# Patient Record
Sex: Male | Born: 1995 | Race: Black or African American | Hispanic: No | Marital: Single | Smoking: Never smoker
Health system: Southern US, Community
[De-identification: ages and names within clinical notes are randomized; demographics above are authoritative.]

---
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
## 2015-11-14 IMAGING — US US ART/VEN ABD/PELV/SCROTUM DOPPLER LTD
1 series · 13 of 25 positions shown · non-contrast
Comparison: None.

CLINICAL DATA: Left testicular pain and swelling for 2-3 hr.

EXAM:
SCROTAL ULTRASOUND
DOPPLER ULTRASOUND OF THE TESTICLES
TECHNIQUE: Complete ultrasound examination of the testicles, epididymis, and
other scrotal structures was performed. Color and spectral Doppler
ultrasound were also utilized to evaluate blood flow to the
testicles.

[Series 1: us art/ven abd/pelv/scrotum doppler ltd · 0.06mm/px · 13 of 60 slices shown]
[im 1/60]
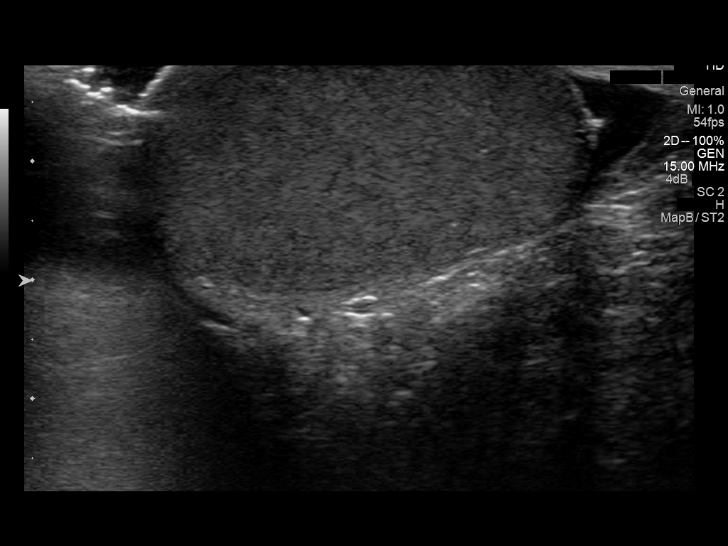
[im 5/60]
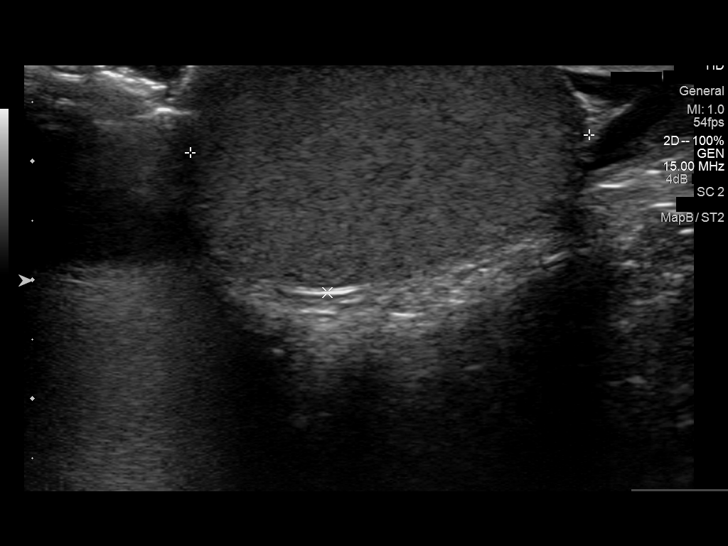
[im 10/60]
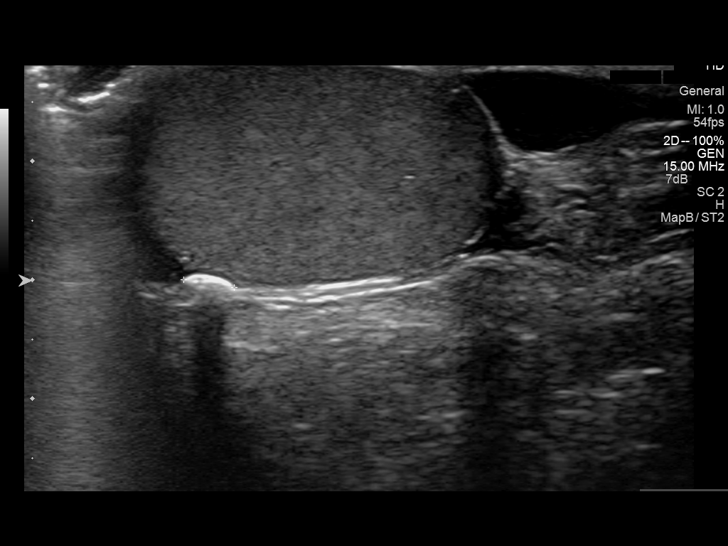
[im 15/60]
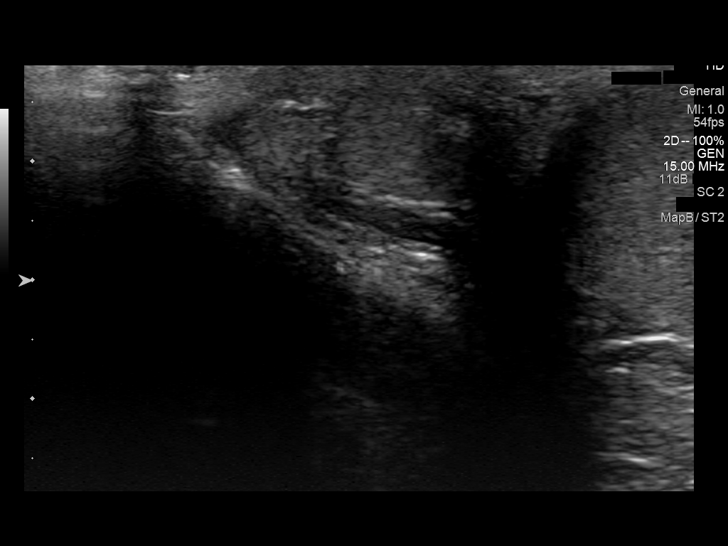
[im 20/60]
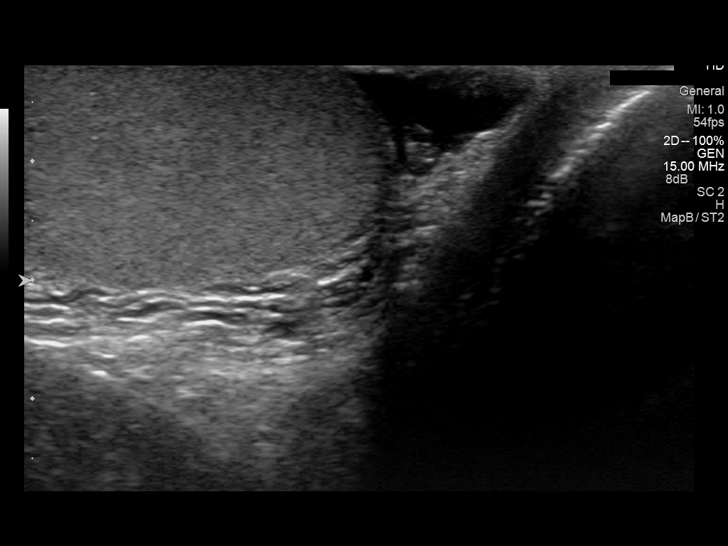
[im 25/60]
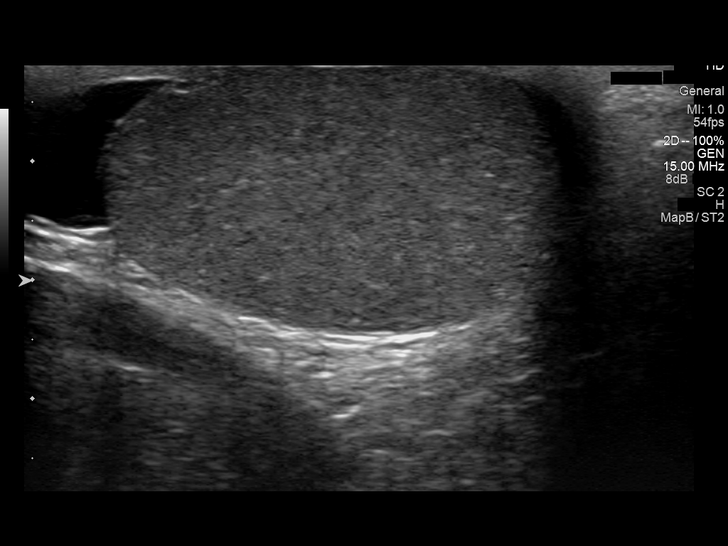
[im 30/60]
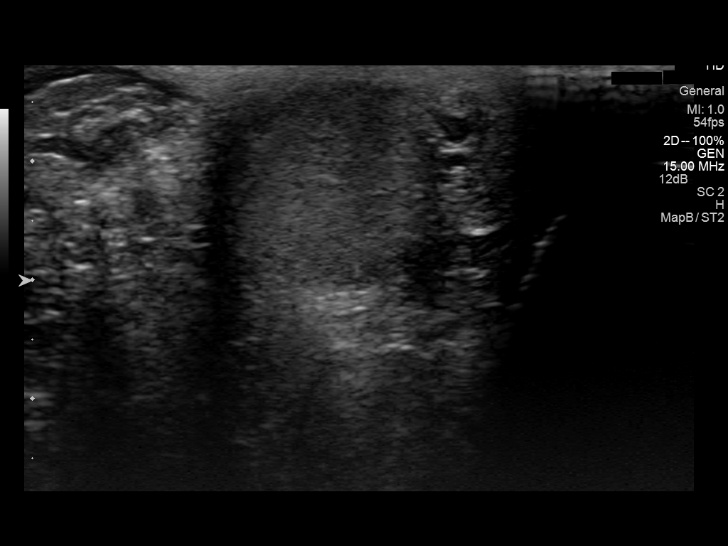
[im 35/60]
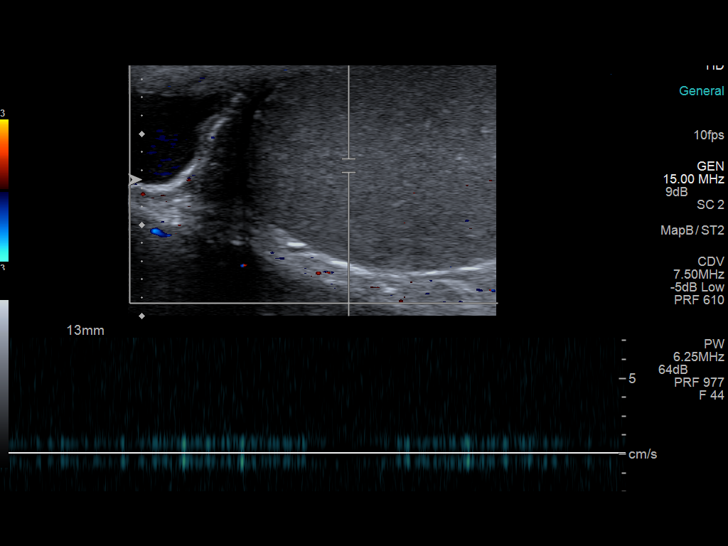
[im 40/60]
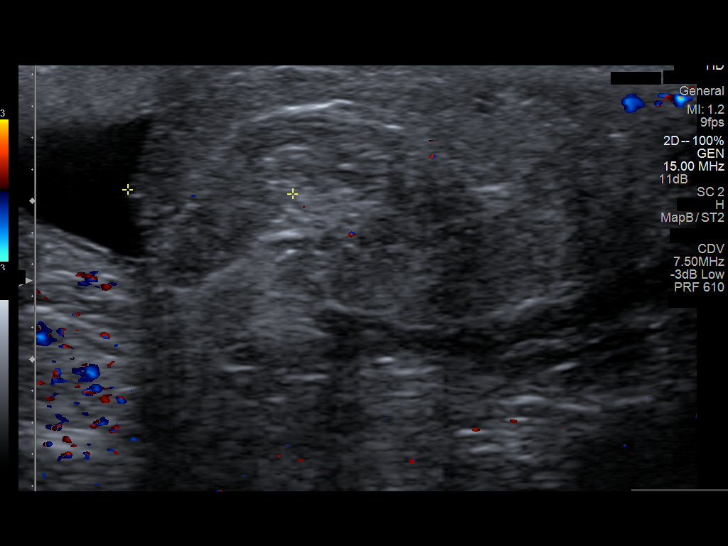
[im 45/60]
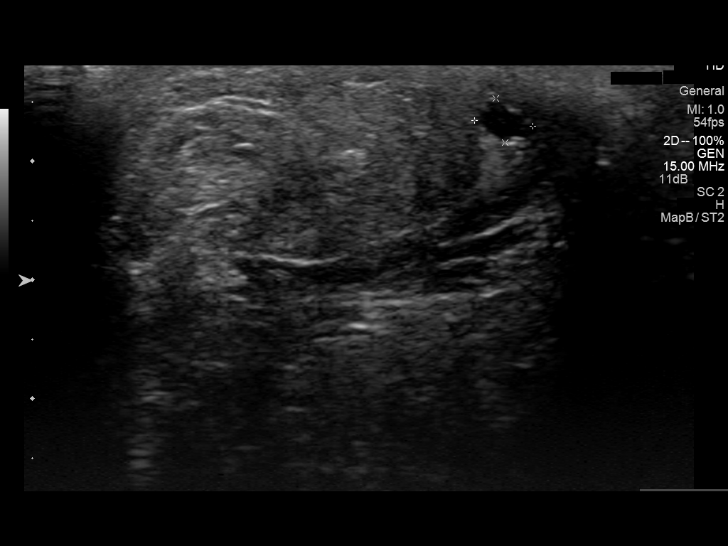
[im 50/60]
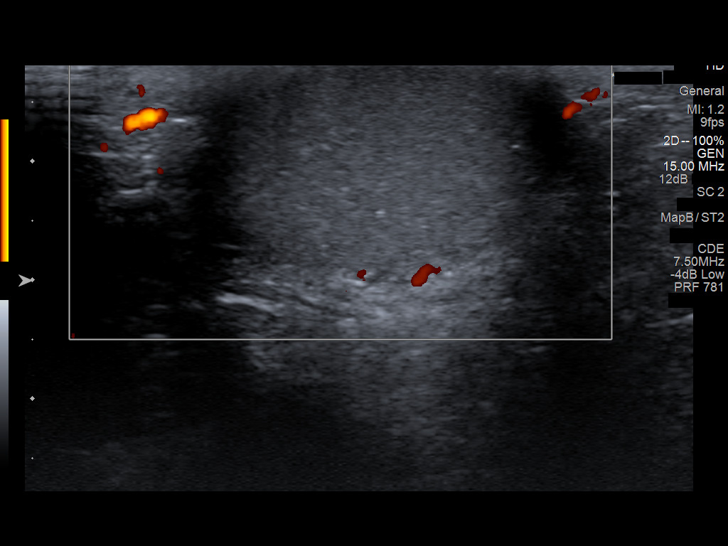
[im 55/60]
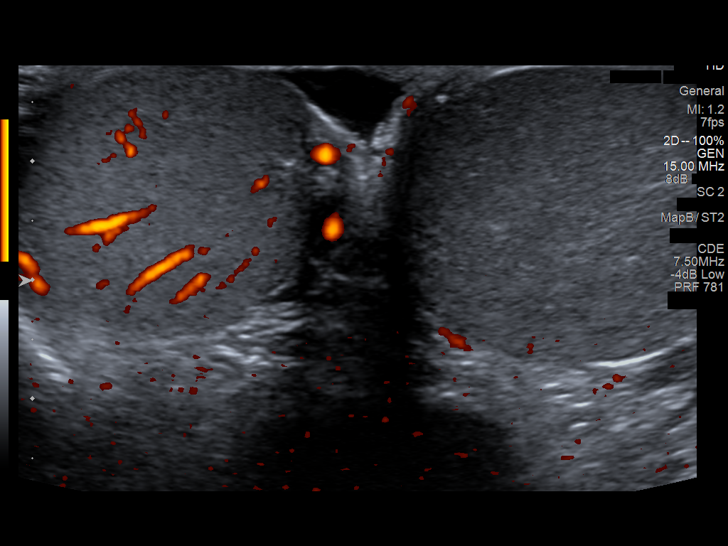
[im 60/60]
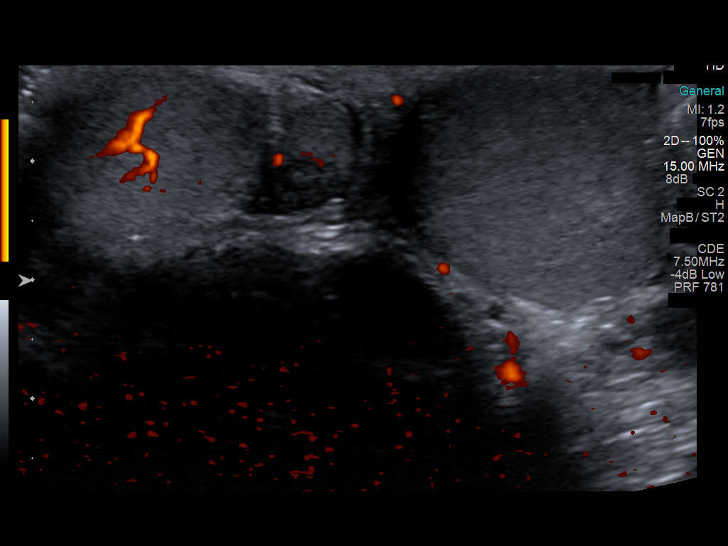

[13 of 25 positions shown; findings below may reference images not displayed]

FINDINGS: Right testicle

Measurements: 3.4 x 2 x 2.8 cm. Echogenic focus posterior to the
testis probably represents a scrotal pearl. Punctate echogenic foci
within the testis consistent with tiny punctate calcifications. No
mass.

Left testicle

Measurements: 3.9 x 2.3 x 2.6 cm. Tiny punctate echogenic foci
demonstrated probably representing small calcifications. No flow is
demonstrated within the testis on color flow Doppler imaging and no
arterial or venous waveforms are reliably obtained. Appearance is
consistent with testicular torsion.

Right epididymis:  Normal in size and appearance.

Left epididymis: Epididymis is enlarged and heterogeneous in
appearance with some a limited color-flow identified. Tiny cyst or
spermatocele measuring 5 mm.

Hydrocele:  Small bilateral hydroceles.

Varicocele:  None visualized.

Pulsed Doppler interrogation of both testes demonstrates low
resistance arterial and venous waveforms on the right. Normal color
flow is demonstrated in the right testis and epididymis. No flow is
demonstrated within the left testis on color flow Doppler imaging
and arterial waveform is demonstrated on the left. Limited venous
waveform on the left with possible waveform obtained intermittently.
IMPRESSION: Decreased flow to the left testis consistent with left testicular
torsion. Normal appearance of the right testis.

These results were called by telephone at the time of interpretation
on 11/08/2013 at [DATE] to Dr. GEORGIANA JIM , who verbally
acknowledged these results.
